# Patient Record
Sex: Female | Born: 1935 | Race: White | Hispanic: No
Health system: Southern US, Community
[De-identification: ages and names within clinical notes are randomized; demographics above are authoritative.]

---

## 2016-10-07 ENCOUNTER — Emergency Department (HOSPITAL_COMMUNITY): Payer: Medicare Other

## 2016-10-07 ENCOUNTER — Emergency Department (HOSPITAL_COMMUNITY)
Admission: EM | Admit: 2016-10-07 | Discharge: 2016-10-07 | Disposition: A | Discharge status: Home / Self Care | Payer: Medicare Other | Attending: Emergency Medicine | Admitting: Emergency Medicine

## 2016-10-07 DIAGNOSIS — S0003XA Contusion of scalp, initial encounter: Secondary | ICD-10-CM

## 2016-10-07 DIAGNOSIS — Y939 Activity, unspecified: Secondary | ICD-10-CM | POA: Insufficient documentation

## 2016-10-07 DIAGNOSIS — W100XXA Fall (on)(from) escalator, initial encounter: Secondary | ICD-10-CM | POA: Insufficient documentation

## 2016-10-07 DIAGNOSIS — S0990XA Unspecified injury of head, initial encounter: Secondary | ICD-10-CM | POA: Diagnosis present

## 2016-10-07 DIAGNOSIS — Y999 Unspecified external cause status: Secondary | ICD-10-CM | POA: Diagnosis not present

## 2016-10-07 DIAGNOSIS — Y929 Unspecified place or not applicable: Secondary | ICD-10-CM | POA: Diagnosis not present

## 2016-10-07 NOTE — Discharge Instructions (Signed)
Return here as needed.  Follow-up with your primary care doctor °

## 2016-10-07 NOTE — ED Notes (Signed)
EKG given to Dr. Jacubowitz. 

## 2016-10-07 NOTE — ED Triage Notes (Addendum)
[facility] [place] going up an escalator unsure how far up. Pt states she did not lose consciousness and states"i fell forward" However pt has large hematoma to back of head. Pt reports having a pacemaker but unsure what type, pt states was placed in [place]. Pt takes blood thinners.

## 2016-10-07 NOTE — ED Notes (Signed)
Pt's daughter given Ventura Brunsheresa Thomas's phone number.

## 2016-10-07 NOTE — ED Notes (Signed)
Pt received a phone call from a Deneise Leverheresa Thomas.  Aggie Cosierheresa requests that when we get in touch with the pt's daughter that we pass along Theresa's phone number to her.  Her cell phone number is [phone].

## 2016-10-07 NOTE — ED Provider Notes (Signed)
[facility]-EMERGENCY DEPT Provider Note   CSN: [id number] Arrival date & time: 10/07/16  1132     History   Chief Complaint Chief Complaint  Patient presents with  . Fall    HPI Caterin Tabares is a 83 y.o. female.  HPI Patient presents to the emergency department withInjuries from a fall that occurred just prior to arrival.  The patient states she was attempting to get on an escalator when she missed a step and fell backwards.  She states she hit her head into the edge of the escalator rail.  Patient states that she did not lose consciousness.  She remembers the entire fall.  She states that the only area of pain that she has her injury is the back of her head.  Patient denies chest pain, shortness of breath, nausea, vomiting, weakness, dizziness, blurred vision, numbness, back pain hip pain, neck pain, near syncope or syncope. No past medical history on file.  There are no active problems to display for this patient.   No past surgical history on file.  OB History    No data available       Home Medications    Prior to Admission medications   Not on File    Family History No family history on file.  Social History Social History  Substance Use Topics  . Smoking status: Not on file  . Smokeless tobacco: Not on file  . Alcohol use Not on file     Allergies   Patient has no known allergies.   Review of Systems Review of Systems  All other systems negative except as documented in the HPI. All pertinent positives and negatives as reviewed in the HPI. Physical Exam Updated Vital Signs BP 126/75   Pulse 84   Temp 98 F (36.7 C) (Oral)   Resp 18   Ht 5\' 5"  (1.651 m)   Wt 61.2 kg (135 lb)   SpO2 98%   BMI 22.47 kg/m   Physical Exam  Constitutional: She is oriented to person, place, and time. She appears well-developed and well-nourished. No distress.  HENT:  Head: Normocephalic.    Mouth/Throat: Oropharynx is clear and moist.  Eyes: Pupils are equal,  round, and reactive to light.  Neck: Normal range of motion. Neck supple.  Cardiovascular: Normal rate, regular rhythm and normal heart sounds.  Exam reveals no gallop and no friction rub.   No murmur heard. Pulmonary/Chest: Effort normal and breath sounds normal. No respiratory distress. She has no wheezes.  Abdominal: Soft. Bowel sounds are normal. She exhibits no distension. There is no tenderness.  Neurological: She is alert and oriented to person, place, and time. She exhibits normal muscle tone. Coordination normal.  Skin: Skin is warm and dry. Capillary refill takes less than 2 seconds. No rash noted. No erythema.  Psychiatric: She has a normal mood and affect. Her behavior is normal.  Nursing note and vitals reviewed.    ED Treatments / Results  Labs (all labs ordered are listed, but only abnormal results are displayed) Labs Reviewed - No data to display  EKG  EKG Interpretation  Date/Time:  Tuesday October 07 2016 12:08:57 EDT Ventricular Rate:  86 PR Interval:    QRS Duration: 148 QT Interval:  406 QTC Calculation: 486 R Axis:   -75 Text Interpretation:  AV dissociation LVH with IVCD, LAD and secondary repol abnrm Inferior infarct, acute (RCA) Anterolateral infarct, old Probable RV involvement, suggest recording right precordial leads No old tracing to compare Confirmed  by Doug SouJacubowitz, Sam [phone]) on 10/07/2016 12:34:02 PM       Radiology No results found.  Procedures Procedures (including critical care time)  Medications Ordered in ED Medications - No data to display   Initial Impression / Assessment and Plan / ED Course  I have reviewed the triage vital signs and the nursing notes.  Pertinent labs & imaging results that were available during my care of the patient were reviewed by me and considered in my medical decision making (see chart for details).     Patient will be advised follow-up with her primary doctor, told to return here as needed.  Patient agrees  the plan and all questions were answered Final Clinical Impressions(s) / ED Diagnoses   Final diagnoses:  None    New Prescriptions New Prescriptions   No medications on file     Charlestine NightLawyer, Almedia Cordell, PA-C 10/07/16 1534    Doug SouJacubowitz, Sam, MD 10/07/16 1650

## 2018-04-29 DIAGNOSIS — I340 Nonrheumatic mitral (valve) insufficiency: Secondary | ICD-10-CM

## 2018-04-29 DIAGNOSIS — E785 Hyperlipidemia, unspecified: Secondary | ICD-10-CM

## 2018-04-29 DIAGNOSIS — I480 Paroxysmal atrial fibrillation: Secondary | ICD-10-CM

## 2018-04-29 DIAGNOSIS — Z720 Tobacco use: Secondary | ICD-10-CM

## 2018-04-29 DIAGNOSIS — Z7289 Other problems related to lifestyle: Secondary | ICD-10-CM

## 2018-04-29 DIAGNOSIS — R1310 Dysphagia, unspecified: Secondary | ICD-10-CM

## 2018-04-29 DIAGNOSIS — I5031 Acute diastolic (congestive) heart failure: Secondary | ICD-10-CM

## 2018-04-29 DIAGNOSIS — I351 Nonrheumatic aortic (valve) insufficiency: Secondary | ICD-10-CM

## 2018-04-29 DIAGNOSIS — I361 Nonrheumatic tricuspid (valve) insufficiency: Secondary | ICD-10-CM

## 2018-04-29 DIAGNOSIS — S12200A Unspecified displaced fracture of third cervical vertebra, initial encounter for closed fracture: Secondary | ICD-10-CM

## 2018-04-29 DIAGNOSIS — R296 Repeated falls: Secondary | ICD-10-CM

## 2018-04-29 DIAGNOSIS — R531 Weakness: Secondary | ICD-10-CM

## 2018-04-29 DIAGNOSIS — R740 Nonspecific elevation of levels of transaminase and lactic acid dehydrogenase [LDH]: Secondary | ICD-10-CM

## 2018-04-30 DIAGNOSIS — Z720 Tobacco use: Secondary | ICD-10-CM | POA: Diagnosis not present

## 2018-04-30 DIAGNOSIS — R531 Weakness: Secondary | ICD-10-CM | POA: Diagnosis not present

## 2018-04-30 DIAGNOSIS — Z7289 Other problems related to lifestyle: Secondary | ICD-10-CM | POA: Diagnosis not present

## 2018-04-30 DIAGNOSIS — S12200A Unspecified displaced fracture of third cervical vertebra, initial encounter for closed fracture: Secondary | ICD-10-CM | POA: Diagnosis not present

## 2018-09-01 IMAGING — CT CT HEAD W/O CM
3 series · 14 of 47 positions shown, 16 images · non-contrast
Comparison: None available

CLINICAL DATA: Fall, headache, left occipital scalp hematoma.

EXAM:
CT HEAD WITHOUT CONTRAST
TECHNIQUE: Contiguous axial images were obtained from the base of the skull
through the vertex without intravenous contrast.

[Series 3: head 5.0 h30s · axial · 0.44mm/px · z∈[+215,+350]mm · 8 of 33 slices shown, 10 images]
[im 3/33  brain]
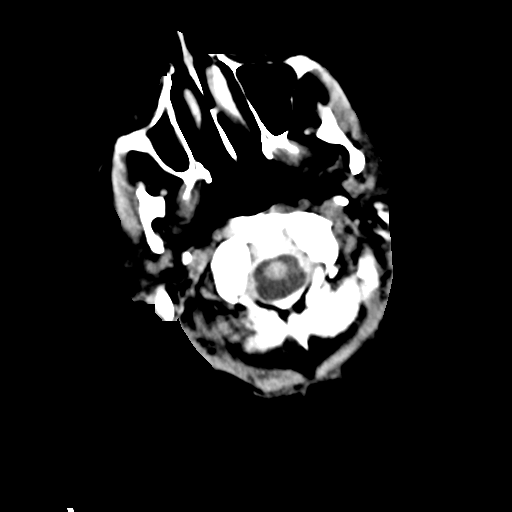
[im 3/33  bone]
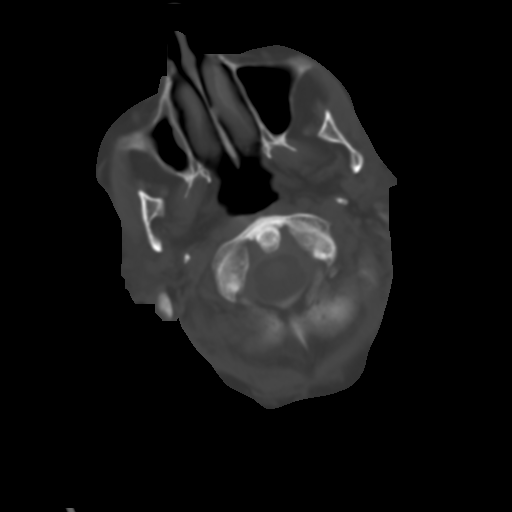
[im 7/33  brain]
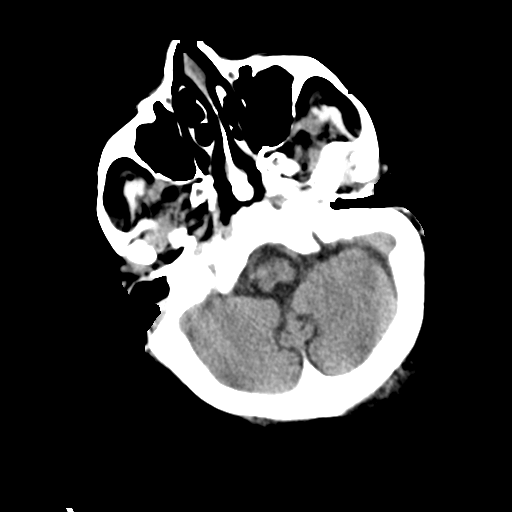
[im 10/33  brain]
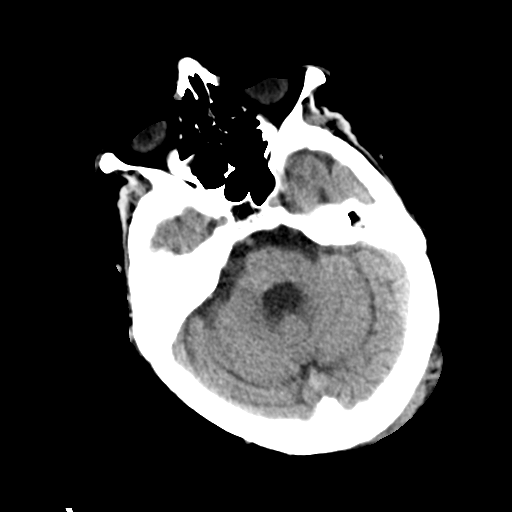
[im 15/33  brain]
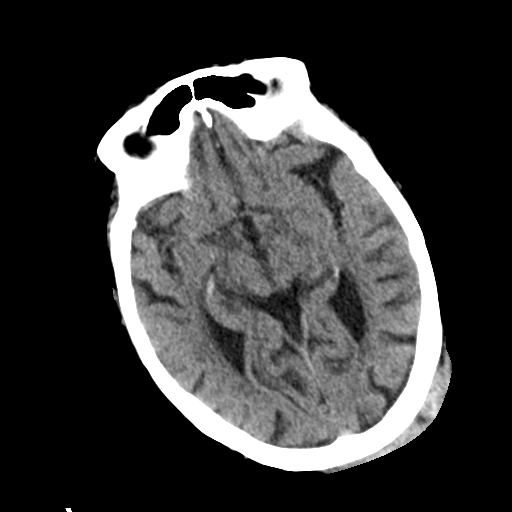
[im 18/33  brain]
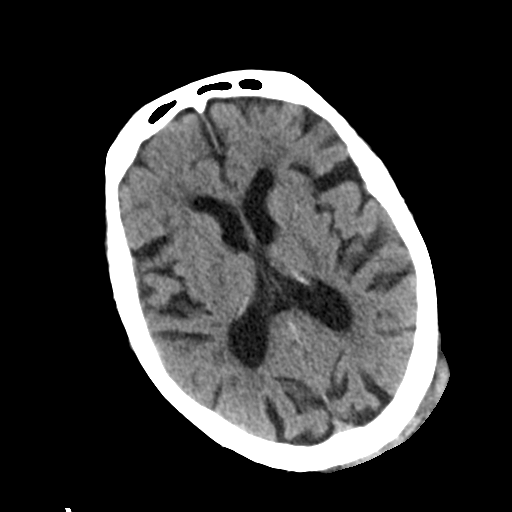
[im 18/33  bone]
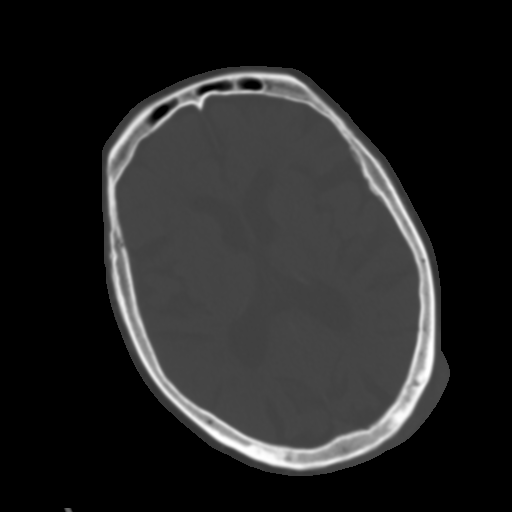
[im 23/33  brain]
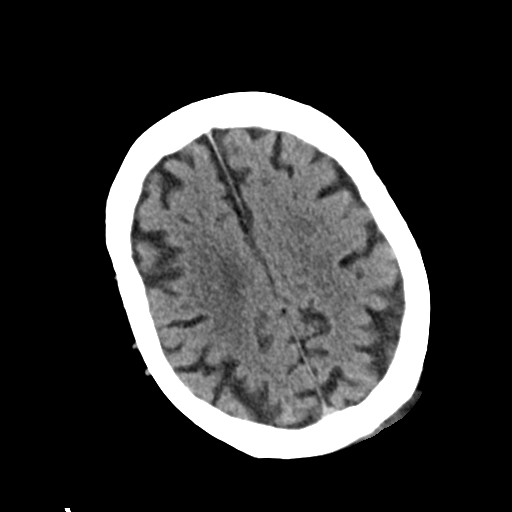
[im 26/33  brain]
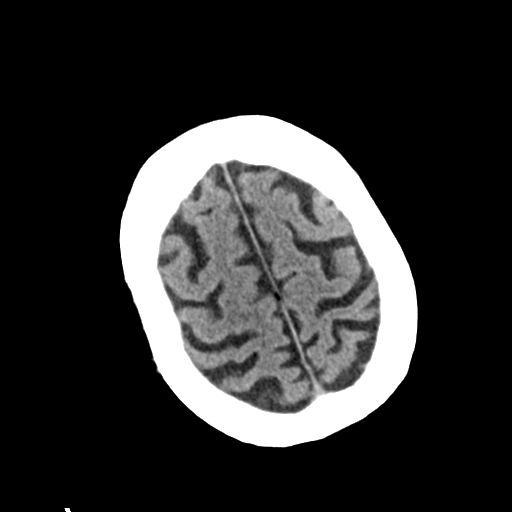
[im 30/33  brain]
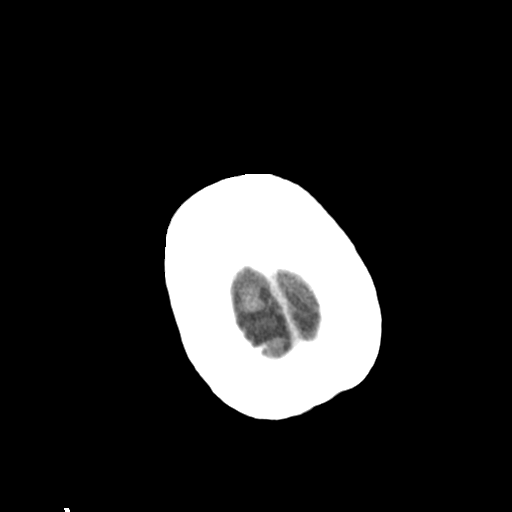

[Series 5: head 3.0 mpr cor · coronal · 0.32mm/px · 3 of 70 slices shown]
[im 24/70  brain]
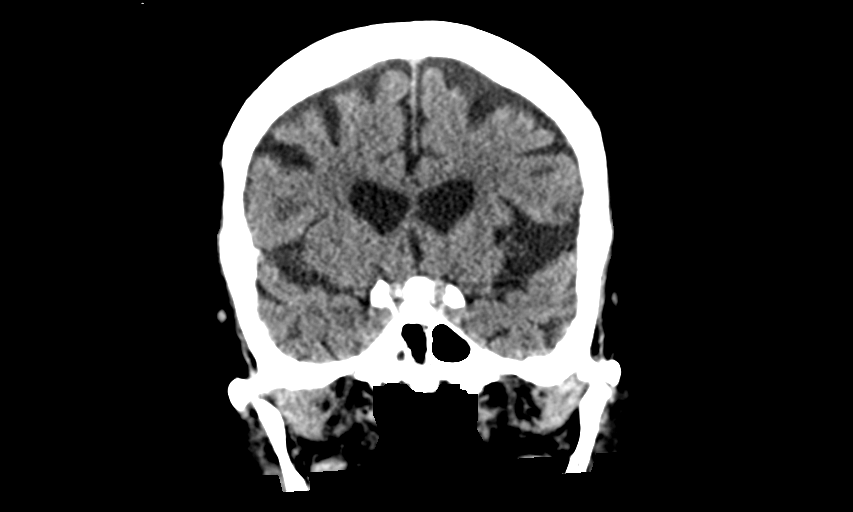
[im 31/70  brain]
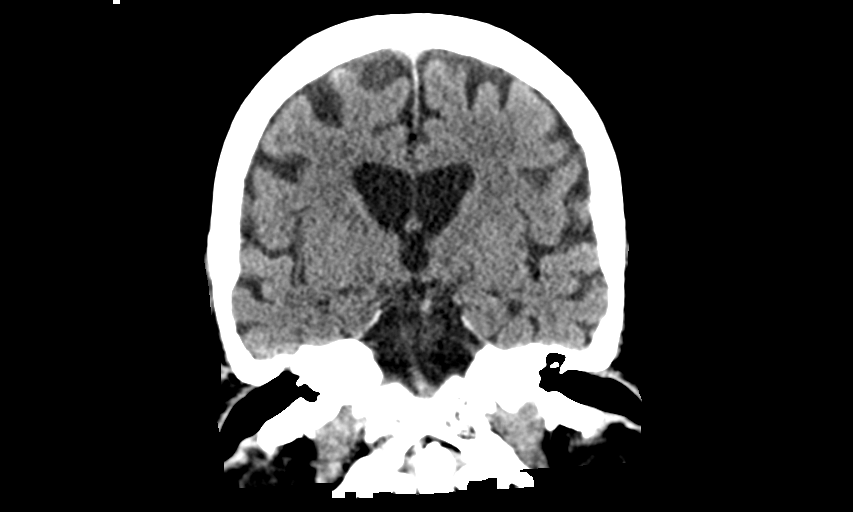
[im 39/70  brain]
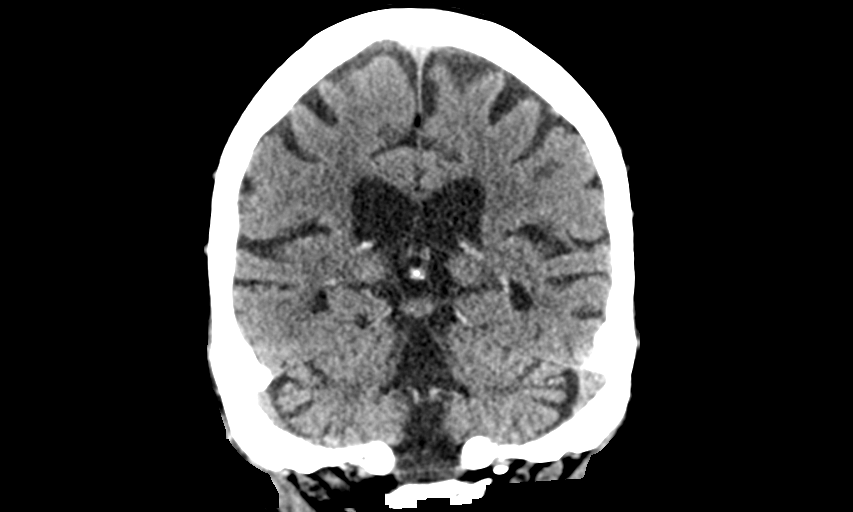

[Series 6: head 3.0 mpr sag · sagittal · 0.32mm/px · 3 of 67 slices shown]
[im 23/67  brain]
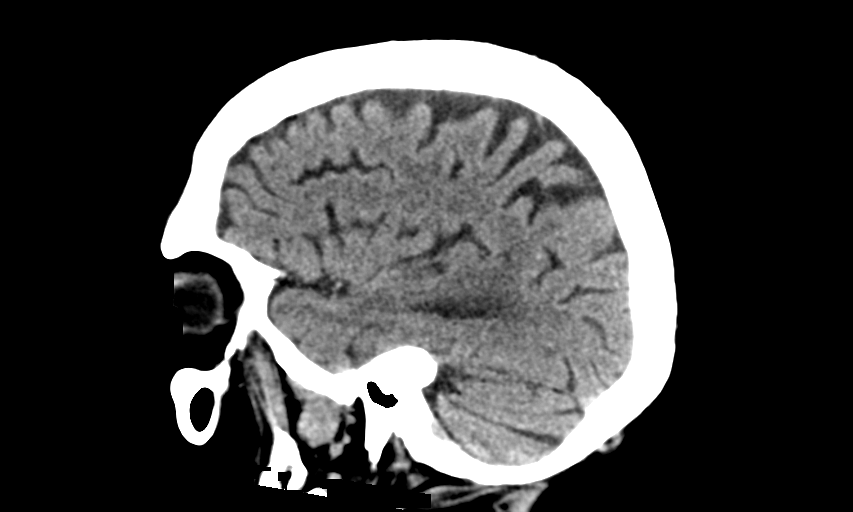
[im 34/67  brain]
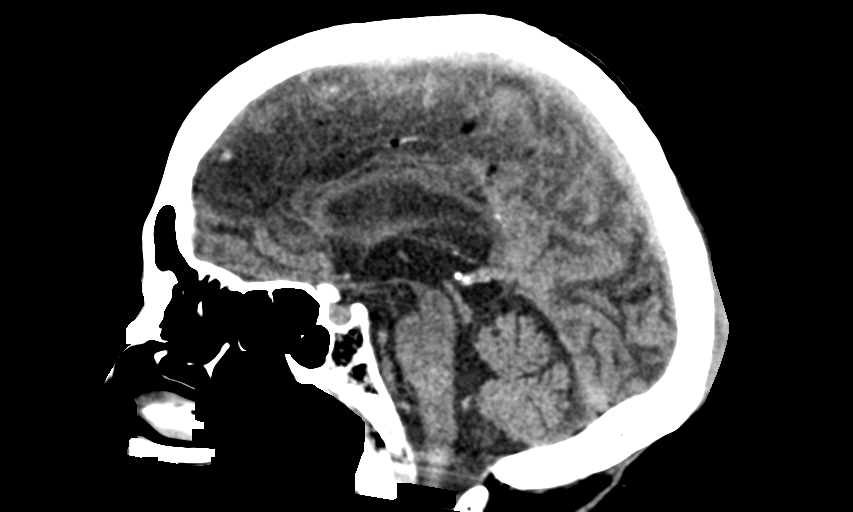
[im 45/67  brain]
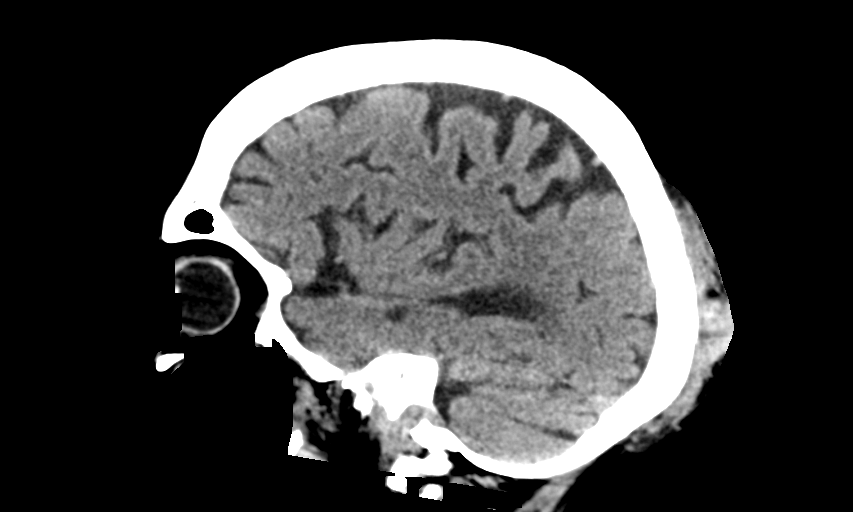

[14 of 47 positions shown; findings below may reference images not displayed]

FINDINGS: Brain: Brain atrophy evident with chronic white matter microvascular
ischemic changes throughout both cerebral hemispheres. No acute
intracranial hemorrhage, mass lesion, new infarction, midline shift,
herniation, hydrocephalus, or extra-axial fluid collection. Normal
gray-white matter differentiation. No focal mass effect or edema.
Cisterns are patent. Cerebellar atrophy as well.

Vascular: No hyperdense vessel or unexpected calcification.

Skull: No displaced or depressed skull fracture. Hyperdense left
posterior occipital region superficial scalp hematoma noted. Chronic
changes of the right mastoid. Mastoid effusions present bilaterally.

Sinuses/Orbits: No acute finding.

Other: None.
IMPRESSION: Acute posterior left occipital scalp superficial scalp hematoma.

No acute intracranial abnormality by noncontrast CT.

Brain atrophy and chronic white matter microvascular ischemic
changes.
